# Patient Record
Sex: Male | Born: 1975 | Hispanic: No | Marital: Single | Smoking: Current some day smoker
Health system: Southern US, Community
[De-identification: ages and names within clinical notes are randomized; demographics above are authoritative.]

---

## 2018-05-20 ENCOUNTER — Encounter (HOSPITAL_COMMUNITY): Payer: Self-pay | Admitting: Emergency Medicine

## 2018-05-20 ENCOUNTER — Observation Stay (HOSPITAL_COMMUNITY)
Admission: EM | Admit: 2018-05-20 | Discharge: 2018-05-21 | Disposition: A | Discharge status: Home / Self Care | Payer: Self-pay | Attending: Internal Medicine | Admitting: Internal Medicine

## 2018-05-20 ENCOUNTER — Other Ambulatory Visit: Payer: Self-pay

## 2018-05-20 ENCOUNTER — Emergency Department (HOSPITAL_COMMUNITY): Payer: Self-pay

## 2018-05-20 DIAGNOSIS — E860 Dehydration: Secondary | ICD-10-CM

## 2018-05-20 DIAGNOSIS — K529 Noninfective gastroenteritis and colitis, unspecified: Secondary | ICD-10-CM

## 2018-05-20 DIAGNOSIS — F17200 Nicotine dependence, unspecified, uncomplicated: Secondary | ICD-10-CM | POA: Insufficient documentation

## 2018-05-20 DIAGNOSIS — E785 Hyperlipidemia, unspecified: Secondary | ICD-10-CM | POA: Insufficient documentation

## 2018-05-20 DIAGNOSIS — K219 Gastro-esophageal reflux disease without esophagitis: Principal | ICD-10-CM | POA: Insufficient documentation

## 2018-05-20 DIAGNOSIS — R079 Chest pain, unspecified: Secondary | ICD-10-CM

## 2018-05-20 DIAGNOSIS — F419 Anxiety disorder, unspecified: Secondary | ICD-10-CM

## 2018-05-20 LAB — CBC
HCT: 47.3 % (ref 39.0–52.0)
Hemoglobin: 15.4 g/dL (ref 13.0–17.0)
MCH: 29.4 pg (ref 26.0–34.0)
MCHC: 32.6 g/dL (ref 30.0–36.0)
MCV: 90.4 fL (ref 80.0–100.0)
PLATELETS: 323 10*3/uL (ref 150–400)
RBC: 5.23 MIL/uL (ref 4.22–5.81)
RDW: 12.3 % (ref 11.5–15.5)
WBC: 10.2 10*3/uL (ref 4.0–10.5)
nRBC: 0 % (ref 0.0–0.2)

## 2018-05-20 LAB — HEPATIC FUNCTION PANEL
ALK PHOS: 52 U/L (ref 38–126)
ALT: 19 U/L (ref 0–44)
AST: 21 U/L (ref 15–41)
Albumin: 4.5 g/dL (ref 3.5–5.0)
BILIRUBIN DIRECT: 0.1 mg/dL (ref 0.0–0.2)
BILIRUBIN TOTAL: 1 mg/dL (ref 0.3–1.2)
Indirect Bilirubin: 0.9 mg/dL (ref 0.3–0.9)
Total Protein: 7.9 g/dL (ref 6.5–8.1)

## 2018-05-20 LAB — I-STAT TROPONIN, ED
Troponin i, poc: 0 ng/mL (ref 0.00–0.08)
Troponin i, poc: 0.07 ng/mL (ref 0.00–0.08)

## 2018-05-20 LAB — TROPONIN I: Troponin I: <0.03 ng/mL (ref <0.03)

## 2018-05-20 LAB — BASIC METABOLIC PANEL
Anion gap: 5 (ref 5–15)
BUN: 6 mg/dL (ref 6–20)
CALCIUM: 9.3 mg/dL (ref 8.9–10.3)
CHLORIDE: 106 mmol/L (ref 98–111)
CO2: 26 mmol/L (ref 22–32)
Creatinine, Ser: 1.01 mg/dL (ref 0.61–1.24)
GFR calc non Af Amer: >60 mL/min (ref >60)
Glucose, Bld: 116 mg/dL — ABNORMAL HIGH (ref 70–99)
Potassium: 3.7 mmol/L (ref 3.5–5.1)
Sodium: 137 mmol/L (ref 135–145)

## 2018-05-20 LAB — LIPASE, BLOOD: Lipase: 36 U/L (ref 11–51)

## 2018-05-20 LAB — TSH: TSH: 0.765 u[IU]/mL (ref 0.350–4.500)

## 2018-05-20 MED ORDER — ENOXAPARIN SODIUM 40 MG/0.4ML ~~LOC~~ SOLN
40.0000 mg | SUBCUTANEOUS | Status: DC
  Administered 2018-05-20: 40 mg via SUBCUTANEOUS
  Filled 2018-05-20: qty 0.4

## 2018-05-20 MED ORDER — SODIUM CHLORIDE 0.9 % IV SOLN
INTRAVENOUS | Status: AC
  Administered 2018-05-20: 17:00:00 via INTRAVENOUS

## 2018-05-20 MED ORDER — ASPIRIN EC 325 MG PO TBEC
325.0000 mg | DELAYED_RELEASE_TABLET | Freq: Every day | ORAL | Status: DC
  Administered 2018-05-21: 325 mg via ORAL
  Filled 2018-05-20: qty 1

## 2018-05-20 MED ORDER — PANTOPRAZOLE SODIUM 40 MG PO TBEC
40.0000 mg | DELAYED_RELEASE_TABLET | Freq: Two times a day (BID) | ORAL | Status: DC
  Administered 2018-05-20 – 2018-05-21 (×2): 40 mg via ORAL
  Filled 2018-05-20 (×2): qty 1

## 2018-05-20 MED ORDER — PANTOPRAZOLE SODIUM 40 MG PO TBEC
40.0000 mg | DELAYED_RELEASE_TABLET | Freq: Once | ORAL | Status: AC
  Administered 2018-05-20: 40 mg via ORAL
  Filled 2018-05-20: qty 1

## 2018-05-20 MED ORDER — ALPRAZOLAM 0.25 MG PO TABS: 0.2500 mg | ORAL_TABLET | Freq: Two times a day (BID) | ORAL | Status: DC | PRN

## 2018-05-20 MED ORDER — ALUM & MAG HYDROXIDE-SIMETH 200-200-20 MG/5ML PO SUSP
30.0000 mL | Freq: Once | ORAL | Status: AC
  Administered 2018-05-20: 30 mL via ORAL
  Filled 2018-05-20: qty 30

## 2018-05-20 MED ORDER — ACETAMINOPHEN 325 MG PO TABS: 650.0000 mg | ORAL_TABLET | ORAL | Status: DC | PRN

## 2018-05-20 MED ORDER — FAMOTIDINE 20 MG PO TABS: 20.0000 mg | ORAL_TABLET | Freq: Two times a day (BID) | ORAL | 0 refills | Status: DC

## 2018-05-20 MED ORDER — ALUM & MAG HYDROXIDE-SIMETH 200-200-20 MG/5ML PO SUSP: 15.0000 mL | ORAL | Status: DC | PRN

## 2018-05-20 MED ORDER — SUCRALFATE 1 G PO TABS
1.0000 g | ORAL_TABLET | Freq: Once | ORAL | Status: AC
  Administered 2018-05-20: 1 g via ORAL
  Filled 2018-05-20: qty 1

## 2018-05-20 MED ORDER — ASPIRIN 81 MG PO CHEW
324.0000 mg | CHEWABLE_TABLET | Freq: Once | ORAL | Status: AC
  Administered 2018-05-20: 324 mg via ORAL
  Filled 2018-05-20: qty 4

## 2018-05-20 MED ORDER — LIDOCAINE VISCOUS HCL 2 % MT SOLN
15.0000 mL | Freq: Once | OROMUCOSAL | Status: AC
  Administered 2018-05-20: 15 mL via ORAL
  Filled 2018-05-20: qty 15

## 2018-05-20 MED ORDER — ONDANSETRON HCL 4 MG/2ML IJ SOLN: 4.0000 mg | Freq: Four times a day (QID) | INTRAMUSCULAR | Status: DC | PRN

## 2018-05-20 NOTE — ED Provider Notes (Signed)
[facility] EMERGENCY DEPARTMENT Provider Note   CSN: [id number] Arrival date & time: 05/20/18  1114     History   Chief Complaint Chief Complaint  Patient presents with  . Chest Pain    HPI Darren Riggs is a 42 y.o. male.  HPI  Pt is a 42 y/o male who presents to the ED today c/o intermittent, nonradiating 5-6/10 midsternal chest "congestion"/discomfort that has been present for the last 6-7 days. Sxs are not associated with exertion. Sxs are worse after eating. And after smoking cigarettes. States he has had frequent belching and his sxs resolve after burping.  States he has felt a "ball in my throat" and feels like a burning sensation to his upper chest and throat. Has had a sore throat as well that is associated with an acid taste in his mouth. Reports h/o GERD but currently untreated as he states he usually eats healthy. Does note that over the last several weeks he had been eating a lot of greasy/unhealthy foods at work which is not normal for him. He feels his sxs are related to his acid reflux.  His sxs have caused him to have anxiety which makes his heart rate elevated. States he had anxiety during EKG today. He also reports chills, subjective fevers, which have resolved. Denies shortness of breath or cough. Denies abd pain, NV, constipation. Has had some diarrhea.  Denies leg pain/swelling, hemoptysis, recent surgery/trauma, recent long travel, hormone use, personal hx of cancer, or hx of DVT/PE.   Denies h/o HTN, HLD, DM. Reports h/o tobacco use 20 years ago, smoked for 6 years. Pt recently started smoking again tobacco one month ago. Smokes 2-3 cigarettes per day. States he has been drinking 2 glasses of either wine or beer daily for the last year. Has drank more over the past few days because of the holiday.  History reviewed. No pertinent past medical history.  There are no active problems to display for this patient.   History reviewed. No pertinent  surgical history.      Home Medications    Prior to Admission medications   Not on File    Family History History reviewed. No pertinent family history.  Social History Social History   Tobacco Use  . Smoking status: Current Some Day Smoker  . Smokeless tobacco: Never Used  Substance Use Topics  . Alcohol use: Yes    Alcohol/week: 2.0 standard drinks    Types: 2 Cans of beer per week  . Drug use: Never     Allergies   Tramadol   Review of Systems Review of Systems  Constitutional: Positive for chills (resolved) and fever (resolved).  HENT: Positive for congestion and sore throat. Negative for rhinorrhea.   Respiratory: Negative for cough, shortness of breath and wheezing.   Cardiovascular: Positive for chest pain ("congestion", burning).  Gastrointestinal: Positive for diarrhea. Negative for abdominal pain, blood in stool, constipation, nausea and vomiting.  Genitourinary: Negative for dysuria, flank pain and hematuria.  Musculoskeletal: Negative for back pain and neck pain.  Skin: Negative for rash.  Neurological: Negative for dizziness, weakness, numbness and headaches.    Physical Exam Updated Vital Signs BP (!) 149/96   Pulse 79   Temp 98.9 F (37.2 C) (Oral)   Resp 16   Ht 5\' 6"  (1.676 m)   Wt 74 kg   SpO2 99%   BMI 26.33 kg/m   Physical Exam  Constitutional: He appears well-developed and well-nourished.  Non-toxic appearance. He  does not appear ill. No distress.  HENT:  Head: Normocephalic and atraumatic.  Eyes: Conjunctivae are normal.  Neck: Neck supple.  Cardiovascular: Normal rate, regular rhythm and intact distal pulses.  No murmur heard. Pulmonary/Chest: Effort normal and breath sounds normal. No respiratory distress. He has no decreased breath sounds. He has no wheezes. He has no rhonchi. He has no rales.  Abdominal: Soft. Bowel sounds are normal. He exhibits no distension. There is no tenderness.  Musculoskeletal: He exhibits no edema.        Right lower leg: Normal. He exhibits no tenderness and no edema.       Left lower leg: Normal. He exhibits no tenderness and no edema.  Neurological: He is alert.  Skin: Skin is warm and dry. Capillary refill takes less than 2 seconds.  Psychiatric: He has a normal mood and affect.  Nursing note and vitals reviewed.  ED Treatments / Results  Labs (all labs ordered are listed, but only abnormal results are displayed) Labs Reviewed  BASIC METABOLIC PANEL - Abnormal; Notable for the following components:      Result Value   Glucose, Bld 116 (*)    All other components within normal limits  CBC  I-STAT TROPONIN, ED  I-STAT TROPONIN, ED    EKG EKG Interpretation #1  Date/Time:  Saturday May 20 2018 11:20:20 EDT Ventricular Rate:  110 PR Interval:  150 QRS Duration: 80 QT Interval:  326 QTC Calculation: 441 R Axis:   64 Text Interpretation:  Sinus tachycardia no acute ST/T changes No old tracing to compare Confirmed by Pricilla Loveless [phone]) on 05/20/2018 12:02:44 PM  EKG #2  EKG Interpretation  Date/Time:  Saturday May 20 2018 15:26:15 EDT Ventricular Rate:  73 PR Interval:  150 QRS Duration: 88 QT Interval:  418 QTC Calculation: 461 R Axis:   44 Text Interpretation:  Sinus rhythm tachycardia resolved Confirmed by Pricilla Loveless [phone]) on 05/20/2018 3:58:58 PM         Radiology Dg Chest 2 View  Result Date: 05/20/2018 CLINICAL DATA:  Chest pain, burning, and chest congestion off and on for couple days, feels like there is a lump in his throat EXAM: CHEST - 2 VIEW COMPARISON:  None FINDINGS: Normal heart size, mediastinal contours, and pulmonary vascularity. Mild central peribronchial thickening. Lungs otherwise clear. No acute infiltrate, pleural effusion or pneumothorax. Mild biconvex thoracic scoliosis. No acute osseous findings. IMPRESSION: Bronchitic changes without infiltrate. Electronically Signed   By: Ulyses Southward M.D.   On: 05/20/2018 11:49     Procedures Procedures (including critical care time)  Medications Ordered in ED Medications  aspirin chewable tablet 324 mg (324 mg Oral Given 05/20/18 1348)  pantoprazole (PROTONIX) EC tablet 40 mg (40 mg Oral Given 05/20/18 1348)  sucralfate (CARAFATE) tablet 1 g (1 g Oral Given 05/20/18 1349)     Initial Impression / Assessment and Plan / ED Course  I have reviewed the triage vital signs and the nursing notes.  Pertinent labs & imaging results that were available during my care of the patient were reviewed by me and considered in my medical decision making (see chart for details).     Final Clinical Impressions(s) / ED Diagnoses   Final diagnoses:  Chest pain, unspecified type   Pt presenting with midsternal chest congestion/burning which is associated with sore throat and acid reflux.  Symptoms not associated with exertion, shortness of breath, diaphoresis, dizziness or lightheadedness.  Pain does not radiate.  Symptoms are associated with eating  food, and he reports eating a lot more greasy foods recently than normal.  He also recently started smoking drinking more alcohol.  His vital signs initially had some tachycardia which he states is secondary to anxiety.  He has had a normal heart rate and oxygen saturation throughout his entire ED stay.  Remainder vital signs are stable.  Symptoms do not seem consistent with PE, patient has no risk factors for this.  Doubt CHF, AAA. HEART score of 1 (smoking).   Chest x-ray with bronchitic changes, no PNA, PTX or widened mediastinum.  Initial EKG with sinus tach, no ischemic changes.  Repeat EKG with NSR. No ischemic changes. PT given ASA, protonix and Carafate in the ED some improvement of sxs.  CBC, BMP and initial troponin are all normal.  Delta troponin is still normal but is upturning an is now at 0.07.  Will patients uptrending troponin, fell that pt will benefit from admission with further monitoring of EKG and troponins to r/o  ACS. Discussed case with supervising physician Dr. Criss Alvine who is in agreement with the plan.   3:55 PM CONSULT with Dr. Rito Ehrlich with inpatient medical team, who accepts the patient for admission.  ED Discharge Orders         Ordered    famotidine (PEPCID) 20 MG tablet  2 times daily,   Status:  Discontinued     05/20/18 [address] Flornce Record S, PA-C 05/20/18 1559    Pricilla Loveless, MD 05/20/18 2148

## 2018-05-20 NOTE — Discharge Instructions (Addendum)
Gastroesophageal Reflux Disease, Adult Normally, food travels down the esophagus and stays in the stomach to be digested. If a person has gastroesophageal reflux disease (GERD), food and stomach acid move back up into the esophagus. When this happens, the esophagus becomes sore and swollen (inflamed). Over time, GERD can make small holes (ulcers) in the lining of the esophagus. Follow these instructions at home: Diet  Follow a diet as told by your doctor. You may need to avoid foods and drinks such as: ? Coffee and tea (with or without caffeine). ? Drinks that contain alcohol. ? Energy drinks and sports drinks. ? Carbonated drinks or sodas. ? Chocolate and cocoa. ? Peppermint and mint flavorings. ? Garlic and onions. ? Horseradish. ? Spicy and acidic foods, such as peppers, chili powder, curry powder, vinegar, hot sauces, and BBQ sauce. ? Citrus fruit juices and citrus fruits, such as oranges, lemons, and limes. ? Tomato-based foods, such as red sauce, chili, salsa, and pizza with red sauce. ? Fried and fatty foods, such as donuts, french fries, potato chips, and high-fat dressings. ? High-fat meats, such as hot dogs, rib eye steak, sausage, ham, and bacon. ? High-fat dairy items, such as whole milk, butter, and cream cheese.  Eat small meals often. Avoid eating large meals.  Avoid drinking large amounts of liquid with your meals.  Avoid eating meals during the 2-3 hours before bedtime.  Avoid lying down right after you eat.  Do not exercise right after you eat. General instructions  Pay attention to any changes in your symptoms.  Take over-the-counter and prescription medicines only as told by your doctor. Do not take aspirin, ibuprofen, or other NSAIDs unless your doctor says it is okay.  Do not use any tobacco products, including cigarettes, chewing tobacco, and e-cigarettes. If you need help quitting, ask your doctor.  Wear loose clothes. Do not wear anything tight around  your waist.  Raise (elevate) the head of your bed about 6 inches (15 cm).  Try to lower your stress. If you need help doing this, ask your doctor.  If you are overweight, lose an amount of weight that is healthy for you. Ask your doctor about a safe weight loss goal.  Keep all follow-up visits as told by your doctor. This is important. Contact a doctor if:  You have new symptoms.  You lose weight and you do not know why it is happening.  You have trouble swallowing, or it hurts to swallow.  You have wheezing or a cough that keeps happening.  Your symptoms do not get better with treatment.  You have a hoarse voice. Get help right away if:  You have pain in your arms, neck, jaw, teeth, or back.  You feel sweaty, dizzy, or light-headed.  You have chest pain or shortness of breath.  You throw up (vomit) and your throw up looks like blood or coffee grounds.  You pass out (faint).  Your poop (stool) is bloody or black.  You cannot swallow, drink, or eat. This information is not intended to replace advice given to you by your health care provider. Make sure you discuss any questions you have with your health care provider. Document Released: 12/22/2007 Document Revised: 12/11/2015 Document Reviewed: 10/30/2014 Elsevier Interactive Patient Education  2018 [employer]   High Cholesterol High cholesterol is a condition in which the blood has high levels of a white, waxy, fat-like substance (cholesterol). The human body needs small amounts of cholesterol. The liver makes all the cholesterol  that the body needs. Extra (excess) cholesterol comes from the food that we eat. Cholesterol is carried from the liver by the blood through the blood vessels. If you have high cholesterol, deposits (plaques) may build up on the walls of your blood vessels (arteries). Plaques make the arteries narrower and stiffer. Cholesterol plaques increase your risk for heart attack and stroke. Work with  your health care provider to keep your cholesterol levels in a healthy range. What increases the risk? This condition is more likely to develop in people who:  Eat foods that are high in animal fat (saturated fat) or cholesterol.  Are overweight.  Are not getting enough exercise.  Have a family history of high cholesterol.  What are the signs or symptoms? There are no symptoms of this condition. How is this diagnosed? This condition may be diagnosed from the results of a blood test.  If you are older than age 60, your health care provider may check your cholesterol every 4-6 years.  You may be checked more often if you already have high cholesterol or other risk factors for heart disease.  The blood test for cholesterol measures:  "Bad" cholesterol (LDL cholesterol). This is the main type of cholesterol that causes heart disease. The desired level for LDL is less than 100.  "Good" cholesterol (HDL cholesterol). This type helps to protect against heart disease by cleaning the arteries and carrying the LDL away. The desired level for HDL is 60 or higher.  Triglycerides. These are fats that the body can store or burn for energy. The desired number for triglycerides is lower than 150.  Total cholesterol. This is a measure of the total amount of cholesterol in your blood, including LDL cholesterol, HDL cholesterol, and triglycerides. A healthy number is less than 200.  How is this treated? This condition is treated with diet changes, lifestyle changes, and medicines. Diet changes  This may include eating more whole grains, fruits, vegetables, nuts, and fish.  This may also include cutting back on red meat and foods that have a lot of added sugar. Lifestyle changes  Changes may include getting at least 40 minutes of aerobic exercise 3 times a week. Aerobic exercises include walking, biking, and swimming. Aerobic exercise along with a healthy diet can help you maintain a healthy  weight.  Changes may also include quitting smoking. Medicines  Medicines are usually given if diet and lifestyle changes have failed to reduce your cholesterol to healthy levels.  Your health care provider may prescribe a statin medicine. Statin medicines have been shown to reduce cholesterol, which can reduce the risk of heart disease. Follow these instructions at home: Eating and drinking  If told by your health care provider:  Eat chicken (without skin), fish, veal, shellfish, ground [place] breast, and round or loin cuts of red meat.  Do not eat fried foods or fatty meats, such as hot dogs and salami.  Eat plenty of fruits, such as apples.  Eat plenty of vegetables, such as broccoli, potatoes, and carrots.  Eat beans, peas, and lentils.  Eat grains such as barley, rice, couscous, and bulgur wheat.  Eat pasta without cream sauces.  Use skim or nonfat milk, and eat low-fat or nonfat yogurt and cheeses.  Do not eat or drink whole milk, cream, ice cream, egg yolks, or hard cheeses.  Do not eat stick margarine or tub margarines that contain trans fats (also called partially hydrogenated oils).  Do not eat saturated tropical oils, such as coconut  oil and palm oil.  Do not eat cakes, cookies, crackers, or other baked goods that contain trans fats.  General instructions  Exercise as directed by your health care provider. Increase your activity level with activities such as gardening, walking, and taking the stairs.  Take over-the-counter and prescription medicines only as told by your health care provider.  Do not use any products that contain nicotine or tobacco, such as cigarettes and e-cigarettes. If you need help quitting, ask your health care provider.  Keep all follow-up visits as told by your health care provider. This is important. Contact a health care provider if:  You are struggling to maintain a healthy diet or weight.  You need help to start on an exercise  program.  You need help to stop smoking. Get help right away if:  You have chest pain.  You have trouble breathing. This information is not intended to replace advice given to you by your health care provider. Make sure you discuss any questions you have with your health care provider. Document Released: 07/05/2005 Document Revised: 01/31/2016 Document Reviewed: 01/03/2016 Elsevier Interactive Patient Education  [employer].

## 2018-05-20 NOTE — H&P (Signed)
[facility] History and Physical  Darren Riggs ZOX:[id number] DOB: Jan 10, 1976 DOA: 05/20/2018   PCP: No PCP Specialists: None  Chief Complaint: Chest pain  HPI: Darren Riggs is a 42 y.o. male with no significant past medical history currently.  He was diagnosed with the hyperlipidemia few years ago but was able to lose weight and get his cholesterol controlled with lifestyle change.  Denies any history of diabetes or heart disease.  He mentions that about a week or so ago he started developing diarrhea.  He had loose watery stools.  He had some fever and chills although he never checked his temperature.  The symptoms have been resolving over the last few days.  He started smoking cigarettes about a month ago.  About 3 to 4 cigarettes a day.  He is also been drinking 2 drinks of alcohol every day and at times up to 4 drinks.  Few days ago he started developing discomfort in the central part of his chest.  This is associated with bitter taste in his mouth.  He was experiencing some acid reflux and has been burping a lot.  Denies any abdominal pain.  Chest pain at worst was about 6 out of 10 in intensity.  Denies any shortness of breath.  No cough.  He did have some "chest congestion" few days ago.  Denies any sick contacts.  In the emergency department EKG was done which was nonspecific.  Initial troponin was normal.  Subsequent level was noted to be 0.07.  Patient was noted to be anxious.  Chest x-ray did not show any acute findings.  Home Medications: Prior to Admission medications   Not on File    Allergies:  Allergies  Allergen Reactions  . Tramadol Other (See Comments)    Vertigo    Past Medical History: History of hyperlipidemia  Social History: Lives with his brother.  Smokes about 4 to 5 cigarettes a day.  2 alcoholic drinks on a daily basis.  There are days he does not have any drinks.   Family History:  Family History  Problem Relation Age of Onset  . Stroke  Mother   . Heart disease Father      Review of Systems - History obtained from the patient General ROS: positive for  - fatigue Psychological ROS: positive for - anxiety Ophthalmic ROS: negative ENT ROS: negative Allergy and Immunology ROS: negative Hematological and Lymphatic ROS: negative Endocrine ROS: negative Respiratory ROS: no cough, shortness of breath, or wheezing Cardiovascular ROS: As in HPI Gastrointestinal ROS: no abdominal pain, change in bowel habits, or black or bloody stools Genito-Urinary ROS: no dysuria, trouble voiding, or hematuria Musculoskeletal ROS: negative Neurological ROS: no TIA or stroke symptoms Dermatological ROS: negative  Physical Examination  Vitals:   05/20/18 1500 05/20/18 1515 05/20/18 1527 05/20/18 1556  BP: 121/89 126/86 (!) 149/96 (!) 146/85  Pulse: 65 65 79   Resp:   16 16  Temp:      TempSrc:      SpO2: 100% 98% 99%   Weight:      Height:        BP (!) 146/85   Pulse 79   Temp 98.9 F (37.2 C) (Oral)   Resp 16   Ht 5\' 6"  (1.676 m)   Wt 74 kg   SpO2 99%   BMI 26.33 kg/m   General appearance: alert, cooperative, appears stated age and no distress Head: Normocephalic, without obvious abnormality, atraumatic Eyes: conjunctivae/corneas clear. PERRL, EOM's intact.  Throat: lips, mucosa, and tongue normal; teeth and gums normal Neck: no adenopathy, no carotid bruit, no JVD, supple, symmetrical, trachea midline and thyroid not enlarged, symmetric, no tenderness/mass/nodules Back: symmetric, no curvature. ROM normal. No CVA tenderness. Resp: clear to auscultation bilaterally Cardio: regular rate and rhythm, S1, S2 normal, no murmur, click, rub or gallop GI: Abdomen is soft.  Mildly tender in the epigastric area without any rebound rigidity or guarding.  No masses organomegaly.  Bowel sounds are present normal. Extremities: extremities normal, atraumatic, no cyanosis or edema Pulses: 2+ and symmetric Skin: Skin color, texture,  turgor normal. No rashes or lesions Lymph nodes: Cervical, supraclavicular, and axillary nodes normal. Neurologic: Alert and oriented x3.  Cranial nerves II to XII intact.  Motor strength equal bilateral lower extremities   Labs on Admission: I have personally reviewed following labs and imaging studies  CBC: Recent Labs  Lab 05/20/18 1130  WBC 10.2  HGB 15.4  HCT 47.3  MCV 90.4  PLT 323   Basic Metabolic Panel: Recent Labs  Lab 05/20/18 1130  NA 137  K 3.7  CL 106  CO2 26  GLUCOSE 116*  BUN 6  CREATININE 1.01  CALCIUM 9.3   GFR: Estimated Creatinine Clearance: 85.1 mL/min (by C-G formula based on SCr of 1.01 mg/dL).   Radiological Exams on Admission: Dg Chest 2 View  Result Date: 05/20/2018 CLINICAL DATA:  Chest pain, burning, and chest congestion off and on for couple days, feels like there is a lump in his throat EXAM: CHEST - 2 VIEW COMPARISON:  None FINDINGS: Normal heart size, mediastinal contours, and pulmonary vascularity. Mild central peribronchial thickening. Lungs otherwise clear. No acute infiltrate, pleural effusion or pneumothorax. Mild biconvex thoracic scoliosis. No acute osseous findings. IMPRESSION: Bronchitic changes without infiltrate. Electronically Signed   By: Ulyses Southward M.D.   On: 05/20/2018 11:49    My interpretation of Electrocardiogram: Initial EKG showed sinus tachycardia at 110s per minute.  No concerning ischemic changes.  Intervals were normal.  Subsequent EKG showed heart rate in the 70s without any ischemic changes.   Problem List  Principal Problem:   Chest pain Active Problems:   Dehydration   Acute gastroenteritis   Anxiety   Assessment: This is a 42 year old male of [place] origin who presents with chest discomfort.  Symptomatology suggests GI etiology.  He is likely experiencing severe acid reflux.  He is extremely anxious.  He is worried that he is having a heart attack.  Patient has also experienced palpitations.  EKG  without any ischemic changes.  Plan: #1 chest discomfort: Most likely GI in origin.  There has been a slight up trend in his troponin level though still within normal limits.  Patient with history of dyslipidemia previously.  His blood pressure is noted to be elevated here in the hospital.  No known history of hypertension.  It might be prudent to observe the patient in the hospital to rule him out for acute coronary syndrome.  Troponin will be trended.  Lipid panel will be checked.  Check TSH.  Repeat EKG in the morning.  We will give him antireflux medications.  He was given Protonix and carafate with some improvement in symptoms.  He will be given PPI twice daily for now.  He was slightly tender in the epigastric area.  We will check lipase level and LFTs.  #2  Acute gastroenteritis, recovery phase: He seems to be recovering from acute gastroenteritis.  He did have diarrhea 5 to 7  days ago.  His stools are now soft though still frequent.  His abdomen is benign.  He appears to be dehydrated on examination.  He will be given gentle IV hydration.  Denies any black stools or blood in the stools.  #3  Anxious state: Most likely due to his above symptoms.  Patient was reassured.  Xanax as needed.  High blood pressure could be reflective of his anxiety state. Check TSH as well.  #4  Mild hyperglycemia: This is a nonfasting level.  We will check a fasting level tomorrow morning.  #5 elevated blood pressure: Most likely in the setting of his acute symptoms and anxiety.  Hold off on initiating antihypertensives for now.  We will monitor the trend over the next 24 hours and decide if he needs definitive treatment.   DVT Prophylaxis: Lovenox subcutaneously Code Status: Full code Family Communication: Discussed with the patient Consults called: None  Severity of Illness: The appropriate patient status for this patient is OBSERVATION. Observation status is judged to be reasonable and necessary in order to  provide the required intensity of service to ensure the patient's safety. The patient's presenting symptoms, physical exam findings, and initial radiographic and laboratory data in the context of their medical condition is felt to place them at decreased risk for further clinical deterioration. Furthermore, it is anticipated that the patient will be medically stable for discharge from the hospital within 2 midnights of admission. The following factors support the patient status of observation.   " The patient's presenting symptoms include chest discomfort. " The physical exam findings include anxiety, hypertension. " The initial radiographic and laboratory data are unremarkable as of now.  Further management decisions will depend on results of further testing and patient's response to treatment.   Osvaldo Shipper  [facility] Pager [phone]  If 7PM-7AM, please contact night-coverage [email or web address] Password [facility]  05/20/2018, 4:25 PM

## 2018-05-20 NOTE — ED Notes (Signed)
Attempted report to floor x1. Left call back number with secretary. 

## 2018-05-20 NOTE — ED Triage Notes (Signed)
Pt presents to ED for assessment of centralized chest pain, burning up from his stomach, causing a lump in his throat.  Patient states it started 1 week ago.  States he has been drinking most days this month, and also picked up smoking.  States he has a lot of reflux that comes up in his throat.  Patient states he has also been having some looser stools.  Denies SOB.   States laying down makes everything worse.

## 2018-05-21 DIAGNOSIS — K219 Gastro-esophageal reflux disease without esophagitis: Principal | ICD-10-CM

## 2018-05-21 LAB — LIPID PANEL
Cholesterol: 219 mg/dL — ABNORMAL HIGH (ref 0–200)
HDL: 45 mg/dL (ref >40)
LDL CALC: 152 mg/dL — AB (ref 0–99)
Total CHOL/HDL Ratio: 4.9 RATIO
Triglycerides: 111 mg/dL (ref <150)
VLDL: 22 mg/dL (ref 0–40)

## 2018-05-21 LAB — BASIC METABOLIC PANEL
Anion gap: 5 (ref 5–15)
BUN: 9 mg/dL (ref 6–20)
CO2: 27 mmol/L (ref 22–32)
CREATININE: 1.12 mg/dL (ref 0.61–1.24)
Calcium: 8.9 mg/dL (ref 8.9–10.3)
Chloride: 106 mmol/L (ref 98–111)
GFR calc Af Amer: >60 mL/min (ref >60)
GFR calc non Af Amer: >60 mL/min (ref >60)
Glucose, Bld: 96 mg/dL (ref 70–99)
POTASSIUM: 3.7 mmol/L (ref 3.5–5.1)
SODIUM: 138 mmol/L (ref 135–145)

## 2018-05-21 LAB — TROPONIN I: Troponin I: <0.03 ng/mL (ref <0.03)

## 2018-05-21 LAB — HEMOGLOBIN A1C
HEMOGLOBIN A1C: 5.5 % (ref 4.8–5.6)
Mean Plasma Glucose: 111.15 mg/dL

## 2018-05-21 LAB — HIV ANTIBODY (ROUTINE TESTING W REFLEX): HIV SCREEN 4TH GENERATION: NONREACTIVE

## 2018-05-21 MED ORDER — ALUM & MAG HYDROXIDE-SIMETH 200-200-20 MG/5ML PO SUSP: 15.0000 mL | ORAL | 0 refills | Status: AC | PRN

## 2018-05-21 MED ORDER — PANTOPRAZOLE SODIUM 40 MG PO TBEC: DELAYED_RELEASE_TABLET | ORAL | 0 refills | Status: AC

## 2018-05-21 NOTE — Care Management Note (Signed)
Case Management Note  Patient Details  Name: Darren Riggs MRN: [id number] Date of Birth: December 23, 1975  Subjective/Objective:                    Action/Plan:  Patient provided with GoodRx card for prescriptions. No other CM needs identified. Signing off.   Expected Discharge Date:  05/21/18               Expected Discharge Plan:     In-House Referral:     Discharge planning Services  CM Consult, Medication Assistance  Post Acute Care Choice:    Choice offered to:     DME Arranged:    DME Agency:     HH Arranged:    HH Agency:     Status of Service:  Completed, signed off  If discussed at [employer] of Stay Meetings, dates discussed:    Additional Comments:  Lawerance Sabal, RN 05/21/2018, 10:31 AM

## 2018-05-21 NOTE — Progress Notes (Signed)
Pt identified as CP unit protocol patient; chart reviewed, CP felt GI in nature preliminarily per admitter, trops negative, EKG nonischemic. I paged Dr. Rito Ehrlich who does not feel cards needs to see the patient. He anticipates pt will be dc today but Dr. Rito Ehrlich will let us know if cards input is needed. Ainhoa Rallo PA-C

## 2018-05-21 NOTE — Plan of Care (Signed)
  Problem: Clinical Measurements: Goal: Will remain free from infection Outcome: Progressing Note: No s/s of infection noted. Goal: Respiratory complications will improve Outcome: Progressing Note: No s/s of respiratory complications noted.  Stable on room air.   

## 2018-05-21 NOTE — Discharge Summary (Addendum)
[facility]  Physician Discharge Summary   Patient ID: Darren Riggs MRN: [id number] DOB/AGE: [birth year] 42 y.o.  Admit date: 05/20/2018 Discharge date: 05/21/2018  PCP: Patient, No Pcp Per  DISCHARGE DIAGNOSES:  Chest pain secondary to severe acid reflux, now improved Hyperlipidemia Severe GERD  RECOMMENDATIONS FOR OUTPATIENT FOLLOW UP: 1. Establish with PCP for repeat lipid panel in 6-8 weeks.   DISCHARGE CONDITION: fair  Diet recommendation: Heart healthy  Filed Weights   05/20/18 1351 05/21/18 0634  Weight: 74 kg 69.9 kg    INITIAL HISTORY: 42 year old male of [place] origin without any significant past medical history presented with complaints of chest discomfort.  He had been experiencing severe symptoms of acid reflux for the past many days.  Patient was hospitalized for further evaluation.   HOSPITAL COURSE:   Chest discomfort Symptoms most likely secondary to severe GERD.  Patient was given PPI.  Patient was given Maalox.  He ruled out for acute coronary syndrome by serial troponin.  Feels much better this morning.  Symptoms have completely resolved.  TSH is 0.765.  HbA1c 5.5.  LDL 152.  Patient wants to try lifestyle changes which is certainly reasonable.  He has been told that he will need to have his lipid panel rechecked in 6 to 8 weeks.  Severe GERD The patient was given Carafate PPI Maalox.  Symptoms have resolved.  PPI to be continued.  He will be given a prescription for the same.  He has been told to stop cigarette smoking and alcohol consumption.  LFTs and lipase levels were normal.  Stop NSAID use.  Acute gastroenteritis, recovery phase He seems to be recovering from acute gastroenteritis.  He did have diarrhea 5 to 7 days ago.    Symptoms have resolved. Denies any black stools or blood in the stools. HIV is negative.  Anxious state Patient's symptoms secondary to his acute illness.  Patient was reassured.  TSH normal.  No need for further  treatment.    Mild hyperglycemia This was noted on her nonfasting blood level.  Fasting level this morning is normal.  HbA1c is 5.5.  No further testing.    Elevated blood pressure This was noted at the time of admission.  Most likely secondary to his anxiety as well as discomfort.  Blood pressure is now normal.  No need for antihypertensives.  Patient feels much better this morning.  Okay for discharge home.    PERTINENT LABS:  The results of significant diagnostics from this hospitalization (including imaging, microbiology, ancillary and laboratory) are listed below for reference.      Labs: Basic Metabolic Panel: Recent Labs  Lab 05/20/18 1130 05/21/18 0424  NA 137 138  K 3.7 3.7  CL 106 106  CO2 26 27  GLUCOSE 116* 96  BUN 6 9  CREATININE 1.01 1.12  CALCIUM 9.3 8.9   Liver Function Tests: Recent Labs  Lab 05/20/18 1130  AST 21  ALT 19  ALKPHOS 52  BILITOT 1.0  PROT 7.9  ALBUMIN 4.5   Recent Labs  Lab 05/20/18 1130  LIPASE 36   CBC: Recent Labs  Lab 05/20/18 1130  WBC 10.2  HGB 15.4  HCT 47.3  MCV 90.4  PLT 323   Cardiac Enzymes: Recent Labs  Lab 05/20/18 1939 05/20/18 2303  TROPONINI <0.03 <0.03    IMAGING STUDIES Dg Chest 2 View  Result Date: 05/20/2018 CLINICAL DATA:  Chest pain, burning, and chest congestion off and on for couple days, feels like there is  a lump in his throat EXAM: CHEST - 2 VIEW COMPARISON:  None FINDINGS: Normal heart size, mediastinal contours, and pulmonary vascularity. Mild central peribronchial thickening. Lungs otherwise clear. No acute infiltrate, pleural effusion or pneumothorax. Mild biconvex thoracic scoliosis. No acute osseous findings. IMPRESSION: Bronchitic changes without infiltrate. Electronically Signed   By: Ulyses Southward M.D.   On: 05/20/2018 11:49    DISCHARGE EXAMINATION: Vitals:   05/20/18 2111 05/21/18 0036 05/21/18 0634 05/21/18 0818  BP: 123/80 95/64 109/66 111/64  Pulse: 93 67 65 71  Resp:  15 20 18    Temp: 98.1 F (36.7 C) 98.4 F (36.9 C) 98.3 F (36.8 C) 98.7 F (37.1 C)  TempSrc: Oral Oral Oral Oral  SpO2: 100% 98% 100% 100%  Weight:   69.9 kg   Height:       General appearance: alert, cooperative, appears stated age and no distress Resp: clear to auscultation bilaterally Cardio: regular rate and rhythm, S1, S2 normal, no murmur, click, rub or gallop GI: soft, non-tender; bowel sounds normal; no masses,  no organomegaly  DISPOSITION: Home  Discharge Instructions    Call MD for:  difficulty breathing, headache or visual disturbances   Complete by:  As directed    Call MD for:  extreme fatigue   Complete by:  As directed    Call MD for:  persistant dizziness or light-headedness   Complete by:  As directed    Call MD for:  persistant nausea and vomiting   Complete by:  As directed    Call MD for:  severe uncontrolled pain   Complete by:  As directed    Call MD for:  temperature >100.4   Complete by:  As directed    Diet - low sodium heart healthy   Complete by:  As directed    Discharge instructions   Complete by:  As directed    Please modify your diet and increase physical activity and then have your cholesterol levels rechecked in 6 to 8 weeks.  Please stop smoking.  Please cut back significantly on your alcohol intake.  Would recommend no alcohol intake at least for the next 2 to 3 weeks till your acid reflux resolves completely.  You were cared for by a hospitalist during your hospital stay. If you have any questions about your discharge medications or the care you received while you were in the hospital after you are discharged, you can call the unit and asked to speak with the hospitalist on call if the hospitalist that took care of you is not available. Once you are discharged, your primary care physician will handle any further medical issues. Please note that NO REFILLS for any discharge medications will be authorized once you are discharged, as it is  imperative that you return to your primary care physician (or establish a relationship with a primary care physician if you do not have one) for your aftercare needs so that they can reassess your need for medications and monitor your lab values. If you do not have a primary care physician, you can call [phone] for a physician referral.   Increase activity slowly   Complete by:  As directed         Allergies as of 05/21/2018      Reactions   Tramadol Other (See Comments)   Vertigo      Medication List    STOP taking these medications   bismuth subsalicylate 262 MG chewable tablet Commonly known as:  PEPTO BISMOL  dextromethorphan-guaiFENesin 30-600 MG 12hr tablet Commonly known as:  MUCINEX DM   ibuprofen 200 MG tablet Commonly known as:  ADVIL,MOTRIN     TAKE these medications   alum & mag hydroxide-simeth 200-200-20 MG/5ML suspension Commonly known as:  MAALOX/MYLANTA Take 15 mLs by mouth every 4 (four) hours as needed for indigestion or heartburn.   pantoprazole 40 MG tablet Commonly known as:  PROTONIX Take 40 mg twice daily for 1 week and then once daily for 3 weeks and then can stop. Notes to patient:  It would be best to take prior to meal to help coat stomach        Follow-up Information    Bakare, Mobolaji B, MD Follow up.   Specialty:  Internal Medicine Why:  call to establish as PCP Contact information: [address] [place] G[place]s [place] [address] [phone]           TOTAL DISCHARGE TIME: 35 mins  Osvaldo Shipper  [facility] Pager [phone]  05/21/2018, 1:37 PM

## 2018-09-28 ENCOUNTER — Ambulatory Visit: Payer: Self-pay | Admitting: Nurse Practitioner

## 2018-09-28 VITALS — BP 100/70 | HR 81 | Temp 98.4°F | Resp 14 | Wt 152.2 lb

## 2018-09-28 DIAGNOSIS — R059 Cough, unspecified: Secondary | ICD-10-CM

## 2018-09-28 DIAGNOSIS — K219 Gastro-esophageal reflux disease without esophagitis: Secondary | ICD-10-CM

## 2018-09-28 DIAGNOSIS — R05 Cough: Secondary | ICD-10-CM

## 2018-09-28 MED ORDER — BENZONATATE 200 MG PO CAPS: 200.0000 mg | ORAL_CAPSULE | Freq: Three times a day (TID) | ORAL | 0 refills | Status: AC | PRN

## 2018-09-28 MED ORDER — BENZONATATE 200 MG PO CAPS: 200.0000 mg | ORAL_CAPSULE | Freq: Two times a day (BID) | ORAL | 0 refills | Status: DC | PRN

## 2018-09-28 MED ORDER — PANTOPRAZOLE SODIUM 40 MG PO TBEC: 40.0000 mg | DELAYED_RELEASE_TABLET | Freq: Every day | ORAL | 0 refills | Status: AC

## 2018-09-28 NOTE — Patient Instructions (Addendum)
Cough, Adult -Take medication as prescribed. -I feel your acid reflux may be contributing to your cough, throat clearing and the "burning" feeling in your chest.  You will need to ensure you take your Protonix daily. -Increase fluids. -Sleep elevated on 2 pillows at bedtime. -Use a humidifier or vaporizer during sleep. -May use a teaspoon of honey as needed to help with cough. -Follow up with your PCP for worsening shortness of breath, difficulty breathing, fever or other concerns.   Coughing is a reflex that clears your throat and your airways. Coughing helps to heal and protect your lungs. It is normal to cough occasionally, but a cough that happens with other symptoms or lasts a long time may be a sign of a condition that needs treatment. A cough may last only 2-3 weeks (acute), or it may last longer than 8 weeks (chronic). What are the causes? Coughing is commonly caused by:  Breathing in substances that irritate your lungs.  A viral or bacterial respiratory infection.  Allergies.  Asthma.  Postnasal drip.  Smoking.  Acid backing up from the stomach into the esophagus (gastroesophageal reflux).  Certain medicines.  Chronic lung problems, including COPD (or rarely, lung cancer).  Other medical conditions such as heart failure. Follow these instructions at home: Pay attention to any changes in your symptoms. Take these actions to help with your discomfort:  Take medicines only as told by your health care provider. ? If you were prescribed an antibiotic medicine, take it as told by your health care provider. Do not stop taking the antibiotic even if you start to feel better. ? Talk with your health care provider before you take a cough suppressant medicine.  Drink enough fluid to keep your urine clear or pale yellow.  If the air is dry, use a cold steam vaporizer or humidifier in your bedroom or your home to help loosen secretions.  Avoid anything that causes you to cough  at work or at home.  If your cough is worse at night, try sleeping in a semi-upright position.  Avoid cigarette smoke. If you smoke, quit smoking. If you need help quitting, ask your health care provider.  Avoid caffeine.  Avoid alcohol.  Rest as needed. Contact a health care provider if:  You have new symptoms.  You cough up pus.  Your cough does not get better after 2-3 weeks, or your cough gets worse.  You cannot control your cough with suppressant medicines and you are losing sleep.  You develop pain that is getting worse or pain that is not controlled with pain medicines.  You have a fever.  You have unexplained weight loss.  You have night sweats. Get help right away if:  You cough up blood.  You have difficulty breathing.  Your heartbeat is very fast. This information is not intended to replace advice given to you by your health care provider. Make sure you discuss any questions you have with your health care provider. Document Released: 01/01/2011 Document Revised: 12/11/2015 Document Reviewed: 09/11/2014 Elsevier Interactive Patient Education  2019 [employer]  Gastroesophageal Reflux Disease, Adult Gastroesophageal reflux (GER) happens when acid from the stomach flows up into the tube that connects the mouth and the stomach (esophagus). Normally, food travels down the esophagus and stays in the stomach to be digested. However, when a person has GER, food and stomach acid sometimes move back up into the esophagus. If this becomes a more serious problem, the person may be diagnosed with a disease  called gastroesophageal reflux disease (GERD). GERD occurs when the reflux:  Happens often.  Causes frequent or severe symptoms.  Causes problems such as damage to the esophagus. When stomach acid comes in contact with the esophagus, the acid may cause soreness (inflammation) in the esophagus. Over time, GERD may create small holes (ulcers) in the lining of the  esophagus. What are the causes? This condition is caused by a problem with the muscle between the esophagus and the stomach (lower esophageal sphincter, or LES). Normally, the LES muscle closes after food passes through the esophagus to the stomach. When the LES is weakened or abnormal, it does not close properly, and that allows food and stomach acid to go back up into the esophagus. The LES can be weakened by certain dietary substances, medicines, and medical conditions, including:  Tobacco use.  Pregnancy.  Having a hiatal hernia.  Alcohol use.  Certain foods and beverages, such as coffee, chocolate, onions, and peppermint. What increases the risk? You are more likely to develop this condition if you:  Have an increased body weight.  Have a connective tissue disorder.  Use NSAID medicines. What are the signs or symptoms? Symptoms of this condition include:  Heartburn.  Difficult or painful swallowing.  The feeling of having a lump in the throat.  Abitter taste in the mouth.  Bad breath.  Having a large amount of saliva.  Having an upset or bloated stomach.  Belching.  Chest pain. Different conditions can cause chest pain. Make sure you see your health care provider if you experience chest pain.  Shortness of breath or wheezing.  Ongoing (chronic) cough or a night-time cough.  Wearing away of tooth enamel.  Weight loss. How is this diagnosed? Your health care provider will take a medical history and perform a physical exam. To determine if you have mild or severe GERD, your health care provider may also monitor how you respond to treatment. You may also have tests, including:  A test to examine your stomach and esophagus with a small camera (endoscopy).  A test thatmeasures the acidity level in your esophagus.  A test thatmeasures how much pressure is on your esophagus.  A barium swallow or modified barium swallow test to show the shape, size, and  functioning of your esophagus. How is this treated? The goal of treatment is to help relieve your symptoms and to prevent complications. Treatment for this condition may vary depending on how severe your symptoms are. Your health care provider may recommend:  Changes to your diet.  Medicine.  Surgery. Follow these instructions at home: Eating and drinking   Follow a diet as recommended by your health care provider. This may involve avoiding foods and drinks such as: ? Coffee and tea (with or without caffeine). ? Drinks that containalcohol. ? Energy drinks and sports drinks. ? Carbonated drinks or sodas. ? Chocolate and cocoa. ? Peppermint and mint flavorings. ? Garlic and onions. ? Horseradish. ? Spicy and acidic foods, including peppers, chili powder, curry powder, vinegar, hot sauces, and barbecue sauce. ? Citrus fruit juices and citrus fruits, such as oranges, lemons, and limes. ? Tomato-based foods, such as red sauce, chili, salsa, and pizza with red sauce. ? Fried and fatty foods, such as donuts, french fries, potato chips, and high-fat dressings. ? High-fat meats, such as hot dogs and fatty cuts of red and white meats, such as rib eye steak, sausage, ham, and bacon. ? High-fat dairy items, such as whole milk, butter, and cream  cheese.  Eat small, frequent meals instead of large meals.  Avoid drinking large amounts of liquid with your meals.  Avoid eating meals during the 2-3 hours before bedtime.  Avoid lying down right after you eat.  Do not exercise right after you eat. Lifestyle   Do not use any products that contain nicotine or tobacco, such as cigarettes, e-cigarettes, and chewing tobacco. If you need help quitting, ask your health care provider.  Try to reduce your stress by using methods such as yoga or meditation. If you need help reducing stress, ask your health care provider.  If you are overweight, reduce your weight to an amount that is healthy for you.  Ask your health care provider for guidance about a safe weight loss goal. General instructions  Pay attention to any changes in your symptoms.  Take over-the-counter and prescription medicines only as told by your health care provider. Do not take aspirin, ibuprofen, or other NSAIDs unless your health care provider told you to do so.  Wear loose-fitting clothing. Do not wear anything tight around your waist that causes pressure on your abdomen.  Raise (elevate) the head of your bed about 6 inches (15 cm).  Avoid bending over if this makes your symptoms worse.  Keep all follow-up visits as told by your health care provider. This is important. Contact a health care provider if:  You have: ? New symptoms. ? Unexplained weight loss. ? Difficulty swallowing or it hurts to swallow. ? Wheezing or a persistent cough. ? A hoarse voice.  Your symptoms do not improve with treatment. Get help right away if you:  Have pain in your arms, neck, jaw, teeth, or back.  Feel sweaty, dizzy, or light-headed.  Have chest pain or shortness of breath.  Vomit and your vomit looks like blood or coffee grounds.  Faint.  Have stool that is bloody or black.  Cannot swallow, drink, or eat. Summary  Gastroesophageal reflux happens when acid from the stomach flows up into the esophagus. GERD is a disease in which the reflux happens often, causes frequent or severe symptoms, or causes problems such as damage to the esophagus.  Treatment for this condition may vary depending on how severe your symptoms are. Your health care provider may recommend diet and lifestyle changes, medicine, or surgery.  Contact a health care provider if you have new or worsening symptoms.  Take over-the-counter and prescription medicines only as told by your health care provider. Do not take aspirin, ibuprofen, or other NSAIDs unless your health care provider told you to do so.  Keep all follow-up visits as told by your  health care provider. This is important. This information is not intended to replace advice given to you by your health care provider. Make sure you discuss any questions you have with your health care provider. Document Released: 04/14/2005 Document Revised: 01/11/2018 Document Reviewed: 01/11/2018 Elsevier Interactive Patient Education  2019 [employer].  Food Choices for Gastroesophageal Reflux Disease, Adult When you have gastroesophageal reflux disease (GERD), the foods you eat and your eating habits are very important. Choosing the right foods can help ease the discomfort of GERD. Consider working with a diet and nutrition specialist (dietitian) to help you make healthy food choices. What general guidelines should I follow?  Eating plan  Choose healthy foods low in fat, such as fruits, vegetables, whole grains, low-fat dairy products, and lean meat, fish, and poultry.  Eat frequent, small meals instead of three large meals each day. Eat  your meals slowly, in a relaxed setting. Avoid bending over or lying down until 2-3 hours after eating.  Limit high-fat foods such as fatty meats or fried foods.  Limit your intake of oils, butter, and shortening to less than 8 teaspoons each day.  Avoid the following: ? Foods that cause symptoms. These may be different for different people. Keep a food diary to keep track of foods that cause symptoms. ? Alcohol. ? Drinking large amounts of liquid with meals. ? Eating meals during the 2-3 hours before bed.  Cook foods using methods other than frying. This may include baking, grilling, or broiling. Lifestyle  Maintain a healthy weight. Ask your health care provider what weight is healthy for you. If you need to lose weight, work with your health care provider to do so safely.  Exercise for at least 30 minutes on 5 or more days each week, or as told by your health care provider.  Avoid wearing clothes that fit tightly around your waist and chest.   Do not use any products that contain nicotine or tobacco, such as cigarettes and e-cigarettes. If you need help quitting, ask your health care provider.  Sleep with the head of your bed raised. Use a wedge under the mattress or blocks under the bed frame to raise the head of the bed. What foods are not recommended? The items listed may not be a complete list. Talk with your dietitian about what dietary choices are best for you. Grains Pastries or quick breads with added fat. [place] toast. Vegetables Deep fried vegetables. [place] fries. Any vegetables prepared with added fat. Any vegetables that cause symptoms. For some people this may include tomatoes and tomato products, chili peppers, onions and garlic, and horseradish. Fruits Any fruits prepared with added fat. Any fruits that cause symptoms. For some people this may include citrus fruits, such as oranges, grapefruit, pineapple, and lemons. Meats and other protein foods High-fat meats, such as fatty beef or pork, hot dogs, ribs, ham, sausage, salami and bacon. Fried meat or protein, including fried fish and fried chicken. Nuts and nut butters. Dairy Whole milk and chocolate milk. Sour cream. Cream. Ice cream. Cream cheese. Milk shakes. Beverages Coffee and tea, with or without caffeine. Carbonated beverages. Sodas. Energy drinks. Fruit juice made with acidic fruits (such as orange or grapefruit). Tomato juice. Alcoholic drinks. Fats and oils Butter. Margarine. Shortening. Ghee. Sweets and desserts Chocolate and cocoa. Donuts. Seasoning and other foods Pepper. Peppermint and spearmint. Any condiments, herbs, or seasonings that cause symptoms. For some people, this may include curry, hot sauce, or vinegar-based salad dressings. Summary  When you have gastroesophageal reflux disease (GERD), food and lifestyle choices are very important to help ease the discomfort of GERD.  Eat frequent, small meals instead of three large meals each day.  Eat your meals slowly, in a relaxed setting. Avoid bending over or lying down until 2-3 hours after eating.  Limit high-fat foods such as fatty meat or fried foods. This information is not intended to replace advice given to you by your health care provider. Make sure you discuss any questions you have with your health care provider. Document Released: 07/05/2005 Document Revised: 07/06/2016 Document Reviewed: 07/06/2016 Elsevier Interactive Patient Education  2019 [employer]

## 2018-09-28 NOTE — Progress Notes (Signed)
Subjective:     Darren Riggs is a 44 y.o. male here for evaluation of a cough.  The cough is non-productive, without wheezing, dyspnea or hemoptysis and is aggravated by nothing. Onset of symptoms was today  unchanged since that time.  Associated symptoms include scratchy throat. Patient also describes a feeling of "burning" in his chest and throat clearing. Patient does not have a history of asthma. Patient has not had recent travel. Patient does have a history of smoking. Patient informs he smoked 20 years ago, at that time he smoked 5-7 cigarettes per day for 10 years.  Patient informs he had bronchitis several years ago, but not other respiratory conditions at this time.  Patient does have a history of GERD.  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   The following portions of the patient's history were reviewed and updated as appropriate: allergies, current medications and past medical history.  No past medical history on file.   Current Outpatient Medications:  .  alum & mag hydroxide-simeth (MAALOX/MYLANTA) 200-200-20 MG/5ML suspension, Take 15 mLs by mouth every 4 (four) hours as needed for indigestion or heartburn. (Patient not taking: Reported on 09/28/2018), Disp: 355 mL, Rfl: 0 .  pantoprazole (PROTONIX) 40 MG tablet, Take 40 mg twice daily for 1 week and then once daily for 3 weeks and then can stop. (Patient not taking: Reported on 09/28/2018), Disp: 35 tablet, Rfl: 0  Review of Systems Constitutional: negative Eyes: negative Ears, nose, mouth, throat, and face: positive for scratchy throat, negative for ear drainage, earaches, hoarseness, nasal congestion and sore throat Respiratory: positive for cough, negative for asthma, chronic bronchitis, dyspnea on exertion, hemoptysis, pleurisy/chest pain, sputum, stridor and wheezing Cardiovascular: negative Neurological: negative     Objective:   BP 100/70   Pulse  81   Temp 98.4 F (36.9 C)   Resp 14   Wt 152 lb 3.2 oz (69 kg)   SpO2 98%   BMI 24.57 kg/m   Physical Exam Vitals signs reviewed.  Constitutional:      General: He is not in acute distress. HENT:     Head: Normocephalic.     Right Ear: Tympanic membrane, ear canal and external ear normal.     Left Ear: Tympanic membrane, ear canal and external ear normal.     Nose: Mucosal edema present.     Right Turbinates: Enlarged and swollen.     Left Turbinates: Enlarged and swollen.     Right Sinus: No maxillary sinus tenderness or frontal sinus tenderness.     Left Sinus: No maxillary sinus tenderness or frontal sinus tenderness.     Mouth/Throat:     Lips: Pink.     Mouth: Mucous membranes are moist.     Pharynx: Posterior oropharyngeal erythema (mild) present. No pharyngeal swelling, oropharyngeal exudate or uvula swelling.     Tonsils: No tonsillar exudate. Swelling: 0 on the right. 0 on the left.  Eyes:     Pupils: Pupils are equal, round, and reactive to light.  Neck:     Musculoskeletal: Normal range of motion and neck supple.  Cardiovascular:     Rate and Rhythm: Normal rate and regular rhythm.     Pulses: Normal pulses.     Heart sounds: Normal heart sounds.  Pulmonary:     Effort: Pulmonary effort is normal. No respiratory distress.     Breath sounds: Normal breath sounds. No stridor.  No wheezing, rhonchi or rales.  Abdominal:     General: Bowel sounds are normal.     Palpations: Abdomen is soft.     Tenderness: There is no abdominal tenderness.  Lymphadenopathy:     Cervical: No cervical adenopathy.  Skin:    General: Skin is warm and dry.     Capillary Refill: Capillary refill takes less than 2 seconds.  Neurological:     General: No focal deficit present.     Mental Status: He is alert and oriented to person, place, and time.  Psychiatric:        Mood and Affect: Mood normal.        Behavior: Behavior normal.     Assessment:    Cough   GERD   Plan:    Exam findings, diagnosis etiology and medication use and indications reviewed with patient. Follow- Up and discharge instructions provided. No emergent/urgent issues found on exam. The patient's physical exam and symptoms are consistent with cough and GERD.  The patient does not  present with symptoms consistent with COVID-19; however, there is suspicion for an URI or GERD.  The symptoms of "burning" in the patient's chest with the dry cough, throat clearing are more consistent with GERD in addition to the patient reporting he has not been taking his reflux medication.  The patient denies fever, chills, malaise, congestion, SOB, difficulty breathing or other URI symptoms, but did inform the cough medicine he took at home helped.  I am going to prescribe the patient tessalon perles to help with cough and patient will continue taking the OTC medication he has at home.  I am also refilling the patient's Protonix. The patient is well-appearing, is in no acute distress and vitals signs are stable.  Patient was also encouraged to perform symptomatic treatment to include increasing fluids, getting rest, etc.    Patient education was provided. Patient verbalized understanding of information provided and agrees with plan of care (POC), all questions answered. The patient is advised to call or return to clinic if condition does not see an improvement in symptoms, or to seek the care of the closest emergency department if condition worsens with the above plan.   1. Cough  - benzonatate (TESSALON) 200 MG capsule; Take 1 capsule (200 mg total) by mouth 3 (three) times daily as needed for up to 10 days.  Dispense: 30 capsule; Refill: 0 -Take medication as prescribed. -I feel your acid reflux may be contributing to your cough, throat clearing and the "burning" feeling in your chest.  You will need to ensure you take your Protonix daily. -Increase fluids. -Sleep elevated on 2 pillows at bedtime. -Use a humidifier or  vaporizer during sleep. -May use a teaspoon of honey as needed to help with cough. -Follow up with your PCP for worsening shortness of breath, difficulty breathing, fever or other concerns.  2. Gastroesophageal reflux disease, esophagitis presence not specified  - pantoprazole (PROTONIX) 40 MG tablet; Take 1 tablet (40 mg total) by mouth daily for 30 days.  Dispense: 30 tablet; Refill: 0 -Follow up with your PCP if symptoms do not improve.

## 2018-10-02 ENCOUNTER — Telehealth: Payer: Self-pay

## 2018-10-02 NOTE — Telephone Encounter (Signed)
Patient states he is feeling fine.

## 2019-10-04 IMAGING — CR DG CHEST 2V
2 series · 2 of 2 positions shown · non-contrast
Comparison: None

CLINICAL DATA: Chest pain, burning, and chest congestion off and on
for couple days, feels like there is a lump in his throat

EXAM:
CHEST - 2 VIEW

[chest pa]
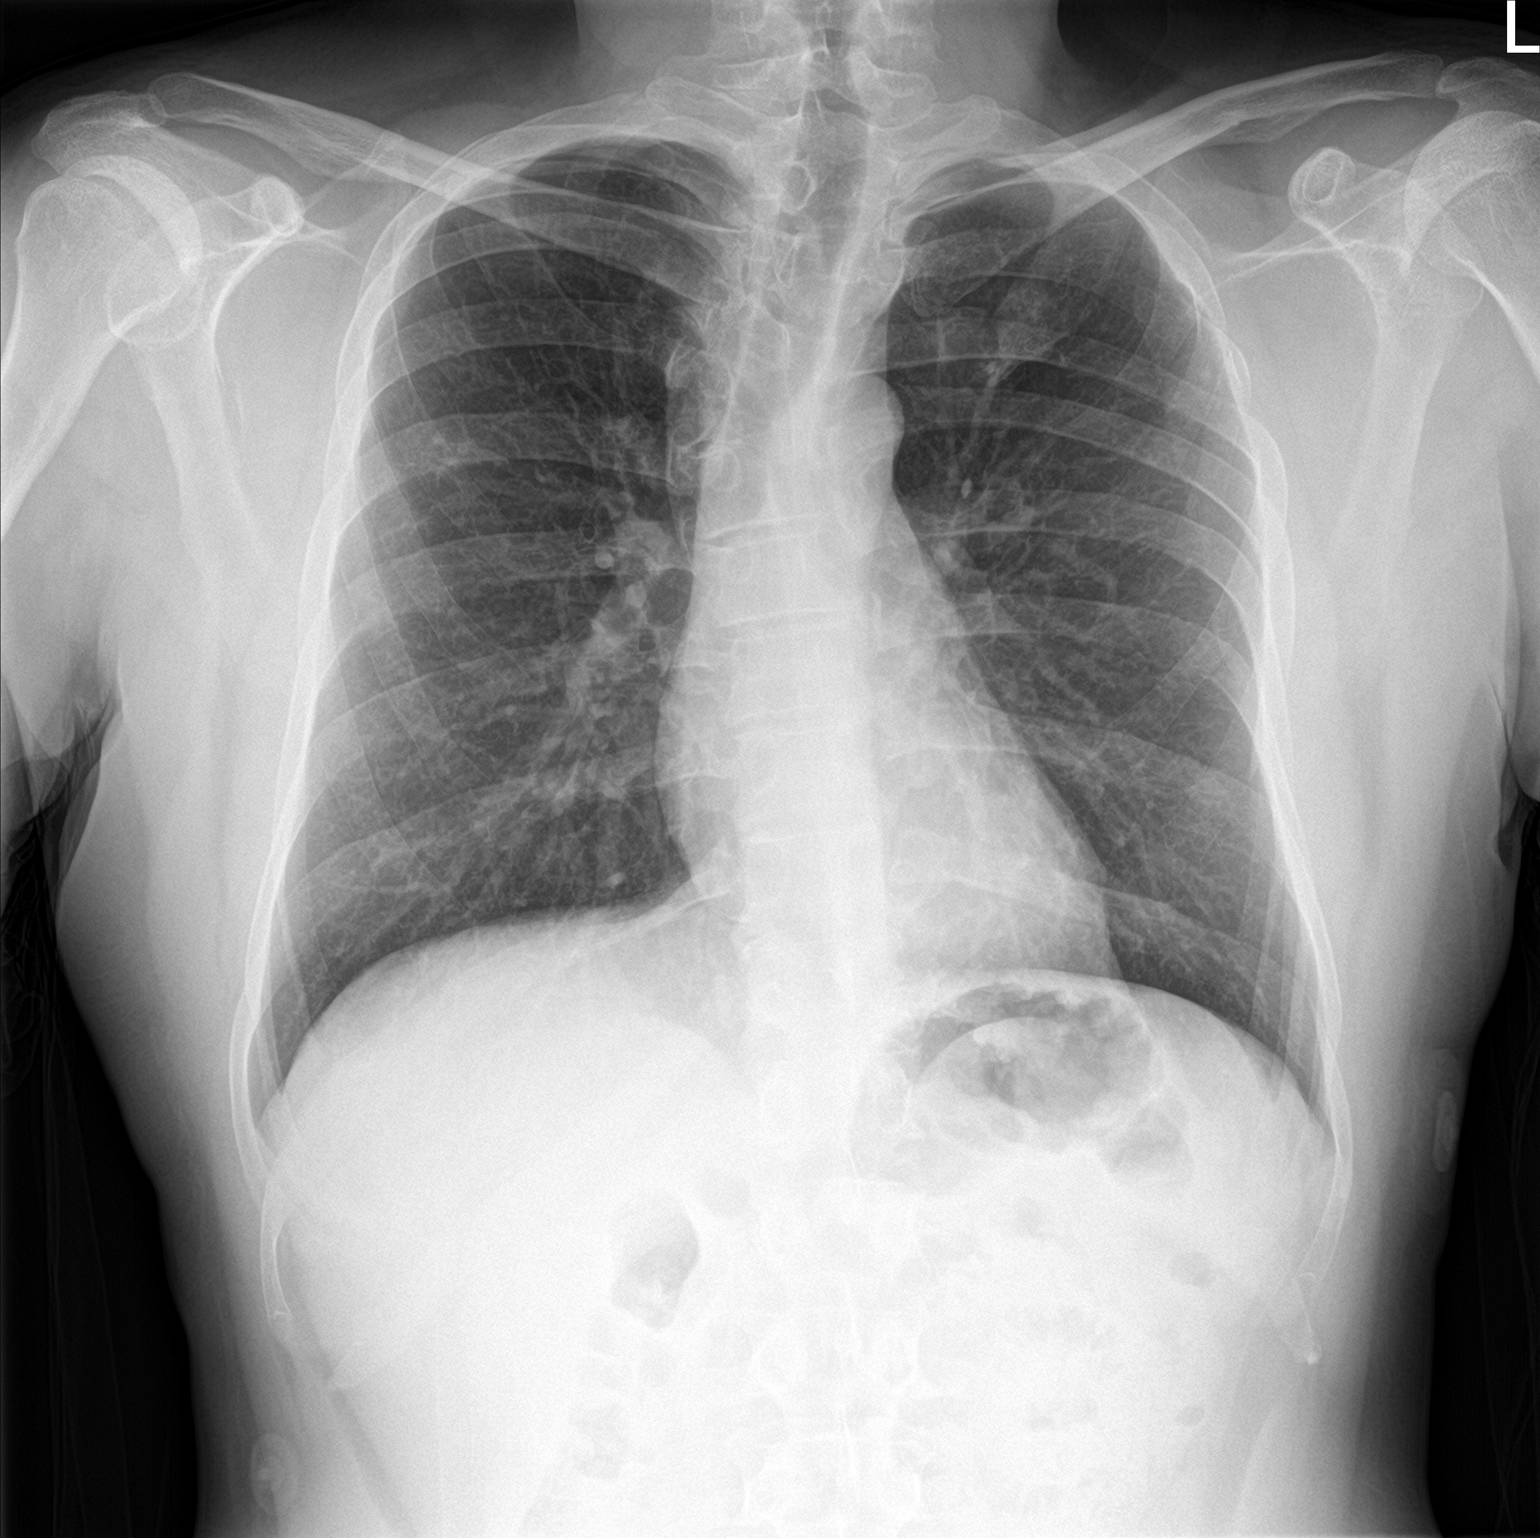

[chest lat]
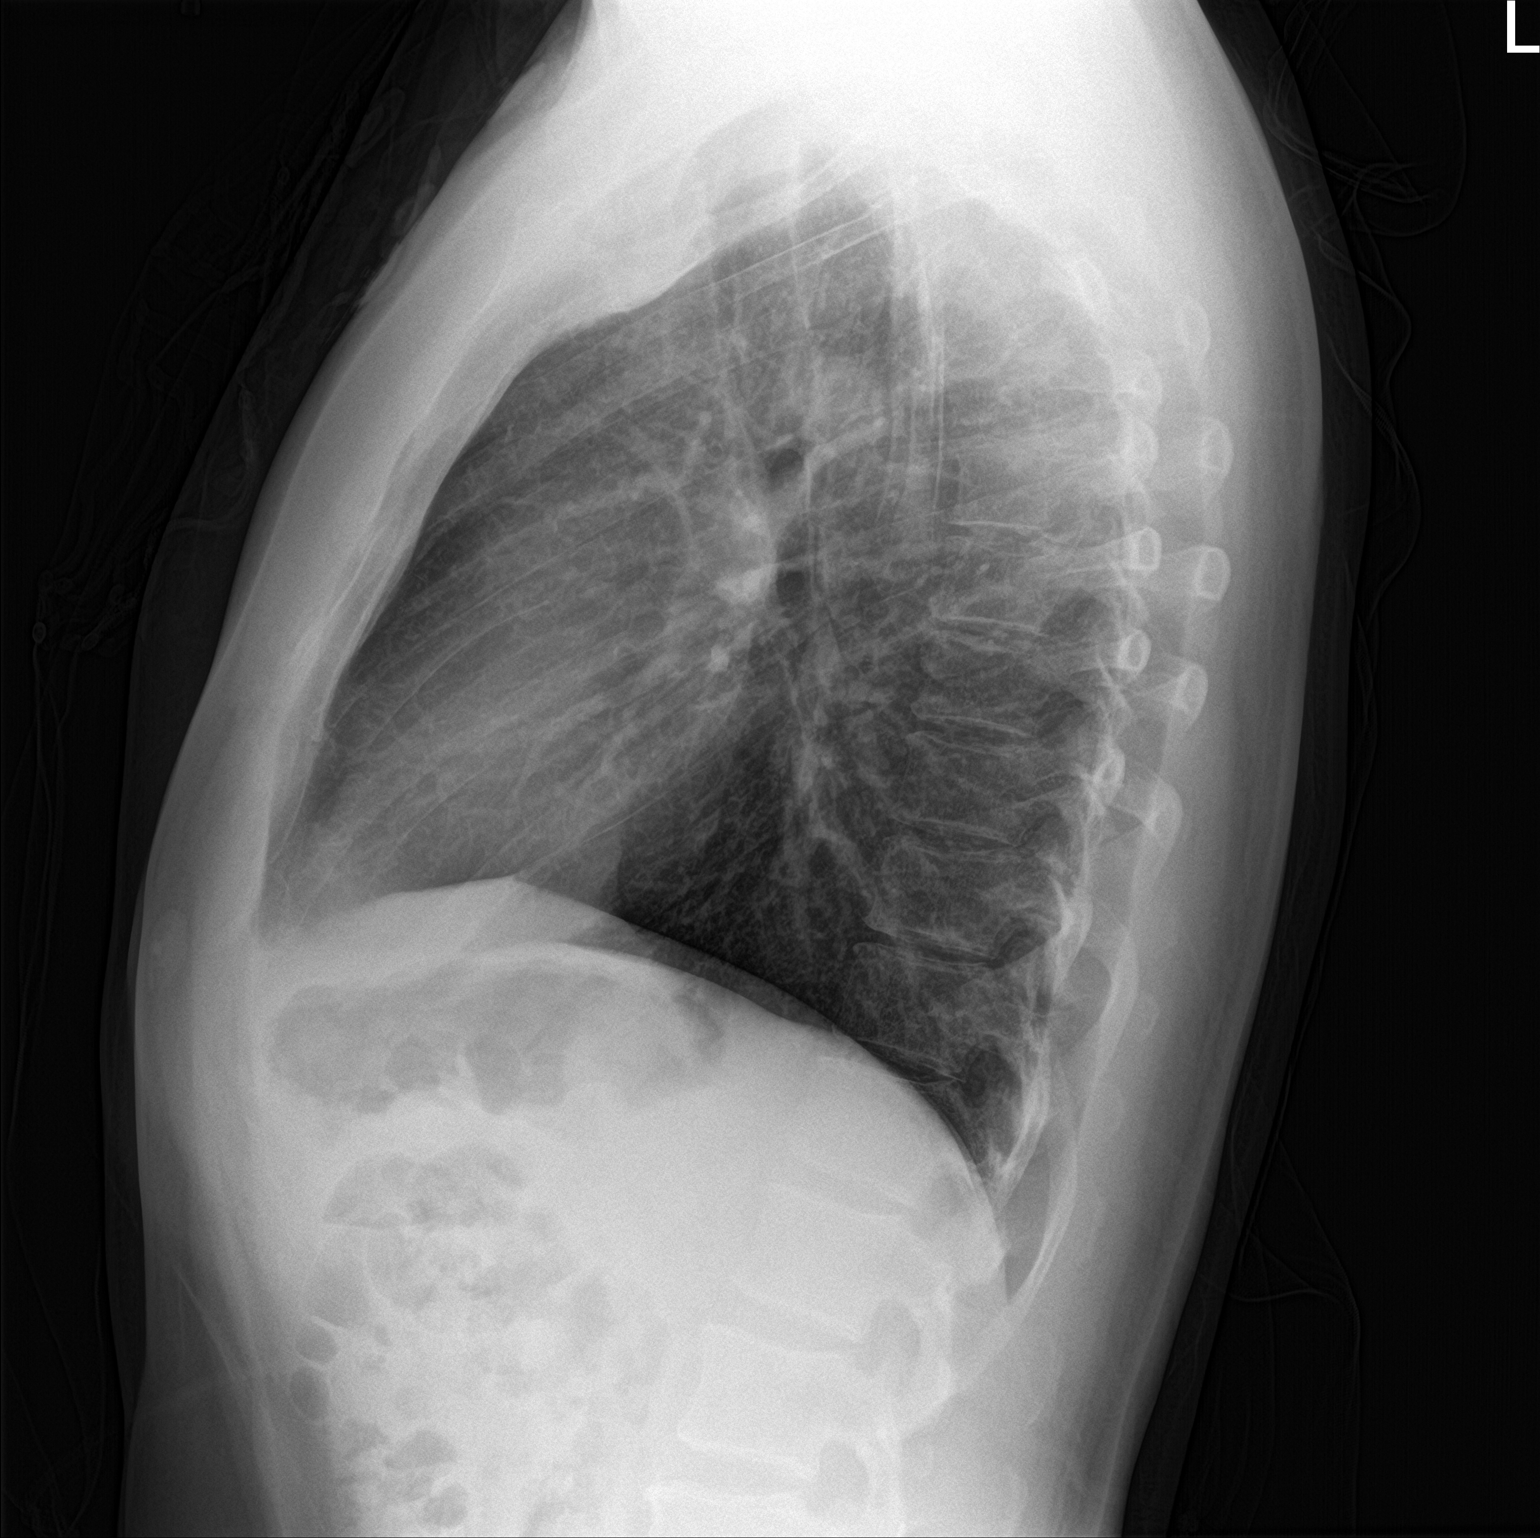

[2 of 2 positions shown; findings below may reference images not displayed]

FINDINGS: Normal heart size, mediastinal contours, and pulmonary vascularity.

Mild central peribronchial thickening.

Lungs otherwise clear.

No acute infiltrate, pleural effusion or pneumothorax.

Mild biconvex thoracic scoliosis.

No acute osseous findings.
IMPRESSION: Bronchitic changes without infiltrate.
